# Patient Record
Sex: Male | Born: 1970 | Race: Black or African American | Hispanic: No | Marital: Single | Smoking: Former smoker
Health system: Southern US, Community
[De-identification: ages and names within clinical notes are randomized; demographics above are authoritative.]

## PROBLEM LIST (undated history)

## (undated) DIAGNOSIS — I10 Essential (primary) hypertension: Secondary | ICD-10-CM

## (undated) HISTORY — PX: KNEE SURGERY: SHX244

## (undated) HISTORY — PX: SHOULDER SURGERY: SHX246

## (undated) HISTORY — PX: TONSILLECTOMY: SUR1361

---

## 2015-06-01 ENCOUNTER — Encounter: Payer: Self-pay | Admitting: Gynecology

## 2015-06-01 ENCOUNTER — Ambulatory Visit
Admission: EM | Admit: 2015-06-01 | Discharge: 2015-06-01 | Disposition: A | Discharge status: Home / Self Care | Payer: BLUE CROSS/BLUE SHIELD | Attending: Family Medicine | Admitting: Family Medicine

## 2015-06-01 DIAGNOSIS — S60469A Insect bite (nonvenomous) of unspecified finger, initial encounter: Secondary | ICD-10-CM

## 2015-06-01 DIAGNOSIS — W57XXXA Bitten or stung by nonvenomous insect and other nonvenomous arthropods, initial encounter: Secondary | ICD-10-CM

## 2015-06-01 DIAGNOSIS — L089 Local infection of the skin and subcutaneous tissue, unspecified: Secondary | ICD-10-CM | POA: Diagnosis not present

## 2015-06-01 MED ORDER — MUPIROCIN 2 % EX OINT: 1.0000 "application " | TOPICAL_OINTMENT | Freq: Three times a day (TID) | CUTANEOUS | Status: DC

## 2015-06-01 MED ORDER — SULFAMETHOXAZOLE-TRIMETHOPRIM 800-160 MG PO TABS: 1.0000 | ORAL_TABLET | Freq: Two times a day (BID) | ORAL | Status: DC

## 2015-06-01 NOTE — Discharge Instructions (Signed)
Cellulitis °Cellulitis is an infection of the skin and the tissue under the skin. The infected area is usually red and tender. This happens most often in the arms and lower legs. °HOME CARE  °· Take your antibiotic medicine as told. Finish the medicine even if you start to feel better. °· Keep the infected arm or leg raised (elevated). °· Put a warm cloth on the area up to 4 times per day. °· Only take medicines as told by your doctor. °· Keep all doctor visits as told. °GET HELP IF: °· You see red streaks on the skin coming from the infected area. °· Your red area gets bigger or turns a dark color. °· Your bone or joint under the infected area is painful after the skin heals. °· Your infection comes back in the same area or different area. °· You have a puffy (swollen) bump in the infected area. °· You have new symptoms. °· You have a fever. °GET HELP RIGHT AWAY IF:  °· You feel very sleepy. °· You throw up (vomit) or have watery poop (diarrhea). °· You feel sick and have muscle aches and pains. °  °This information is not intended to replace advice given to you by your health care provider. Make sure you discuss any questions you have with your health care provider. °  °Document Released: 01/06/2008 Document Revised: 04/10/2015 Document Reviewed: 10/05/2011 °Elsevier Interactive Patient Education ©2016 [employer] ° °Insect Bite °Mosquitoes, flies, fleas, bedbugs, and other insects can bite. Insect bites are different from insect stings. The bite may be red, puffy (swollen), and itchy for 2 to 4 days. Most bites get better on their own. °HOME CARE  °· Do not scratch the bite. °· Keep the bite clean and dry. Wash the bite with soap and water every day, as told by your doctor. °· If directed, apply ice to the bite area. °¨ Put ice in a plastic bag. °¨ Place a towel between your skin and the bag. °¨ Leave the ice on for 20 minutes, 2-3 times per day. °· Follow instructions from your doctor about using medicated  lotions or creams. These can help with itching. °· Apply or take over-the-counter and prescription medicines only as told by your doctor. °· If you were given an antibiotic medicine, use it as told by your doctor. Do not stop using the medicine even if your condition improves. °· Keep all follow-up visits as told by your doctor. This is important. °GET HELP IF: °· You have redness, swelling (inflammation), or pain near your bite that is getting worse. °· You have a fever. °GET HELP RIGHT AWAY IF:  °· You have joint pain.   °· You have fluid, blood, or pus coming from the bite area.   °· You have a headache. °· You have neck pain. °· You feel weaker than you normally do.   °· You have a rash.   °· You have chest pain. °· You have shortness of breath. °· You have stomach pain, feel sick to your stomach (nauseous), or throw up (vomit). °· You feel more tired or sleepy than you normally do. °  °This information is not intended to replace advice given to you by your health care provider. Make sure you discuss any questions you have with your health care provider. °  °Document Released: 07/17/2000 Document Revised: 04/10/2015 Document Reviewed: 12/05/2014 °Elsevier Interactive Patient Education ©2016 [employer] Inc[employer] ° °

## 2015-06-01 NOTE — ED Notes (Addendum)
Patient c/o left middle finger and hand swollen. Patient question insect bite to left middle finger. Per patient notice yesterday.

## 2015-06-01 NOTE — ED Provider Notes (Addendum)
CSN: [id number]     Arrival date & time 06/01/15  0848 History   First MD Initiated Contact with Patient 06/01/15 [phone]    Nurses notes were reviewed. Chief Complaint  Patient presents with  . Hand Problem   patient reports waking up yesterday morning with middle finger swollen and tender. He states his mother told him it looks like there was a bite on the hand on the middle finger but he doesn't remember being bitten by anything as he states he doesn't see any spiders in the house. Explained to him that spiders are not necessarily visible but staph infections can occur from a single spider bite. States his tetanus is up-to-date he does smoke. He denies any trauma to the hand or extremity.` (Consider location/radiation/quality/duration/timing/severity/associated sxs/prior Treatment) Patient is a 44 y.o. male presenting with animal bite. The history is provided by the patient. A language interpreter was used.  Animal Bite Contact animal:  Insect Location:  Finger Finger injury location:  L long finger Time since incident:  1 day Pain details:    Quality:  Aching, sharp and sore   Severity:  Moderate   Timing:  Constant   Progression:  Unchanged Notifications:  None Animal's rabies vaccination status:  Up to date Relieved by:  Nothing Ineffective treatments:  None tried Associated symptoms: swelling   Associated symptoms: no fever, no numbness and no rash     History reviewed. No pertinent past medical history. Past Surgical History  Procedure Laterality Date  . Shoulder surgery Right   . Tonsillectomy    . Knee surgery Left    History reviewed. No pertinent family history. Social History  Substance Use Topics  . Smoking status: Current Every Day Smoker -- 0.50 packs/day  . Smokeless tobacco: None  . Alcohol Use: No    Review of Systems  Constitutional: Negative for fever.  Musculoskeletal: Positive for joint swelling and gait problem.       Patient has to have repeat knee  surgery in a few weeks.  Skin: Negative for rash.  Neurological: Negative for numbness.  All other systems reviewed and are negative.   Allergies  Penicillins  Home Medications   Prior to Admission medications   Medication Sig Start Date End Date Taking? Authorizing Provider  Multiple Vitamin (MULTIVITAMIN) capsule Take 1 capsule by mouth daily.   Yes Historical Provider, MD  ranitidine (ZANTAC) 150 MG/10ML syrup Take by mouth 2 (two) times daily.   Yes Historical Provider, MD  mupirocin ointment (BACTROBAN) 2 % Apply 1 application topically 3 (three) times daily. 06/01/15   Hassan RowanEugene Dayja Loveridge, MD  sulfamethoxazole-trimethoprim (BACTRIM DS,SEPTRA DS) 800-160 MG tablet Take 1 tablet by mouth 2 (two) times daily. 06/01/15   Hassan RowanEugene Cynthia Stainback, MD   Meds Ordered and Administered this Visit  Medications - No data to display  BP 141/94 mmHg  Pulse 81  Temp(Src) 98.2 F (36.8 C)  Resp 18  Ht 6' (1.829 m)  Wt 238 lb (107.956 kg)  BMI 32.27 kg/m2  SpO2 100% No data found.   Physical Exam  Constitutional: He is oriented to person, place, and time. He appears well-developed and well-nourished.  HENT:  Head: Normocephalic and atraumatic.  Eyes: Pupils are equal, round, and reactive to light.  Musculoskeletal: He exhibits edema and tenderness.       Hands: Left middle finger is swollen and very tender to touch  Neurological: He is alert and oriented to person, place, and time.  Skin: Skin is warm. Rash noted.  There is erythema.  Psychiatric: He has a normal mood and affect. His behavior is normal.  Vitals reviewed.   ED Course  Procedures (including critical care time)  Labs Review Labs Reviewed - No data to display  Imaging Review No results found.   Visual Acuity Review  Right Eye Distance:   Left Eye Distance:   Bilateral Distance:    Right Eye Near:   Left Eye Near:    Bilateral Near:         MDM   1. Finger infection   2. Insect bite finger-infected, initial  encounter    Explain to the patient will place him on Septra DS 1 tablet twice a day and ointment applied to the middle finger 3 times a day. I suggested he go to the ED at [facility] since he lives in [place] if not better by Monday since he made to hand surgeon if finger gets worse recommend icing it and soaking in Epsom salts. Patient still seems upset she does remember having insect bite wondering how he could have been bitten.    Hassan Rowan, MD 06/01/15 1030  Hassan Rowan, MD 06/01/15 [id number]

## 2017-05-10 ENCOUNTER — Ambulatory Visit
Admission: EM | Admit: 2017-05-10 | Discharge: 2017-05-10 | Disposition: A | Discharge status: Home / Self Care | Payer: BLUE CROSS/BLUE SHIELD | Attending: Family Medicine | Admitting: Family Medicine

## 2017-05-10 ENCOUNTER — Ambulatory Visit (INDEPENDENT_AMBULATORY_CARE_PROVIDER_SITE_OTHER): Payer: BLUE CROSS/BLUE SHIELD

## 2017-05-10 ENCOUNTER — Encounter: Payer: Self-pay | Admitting: *Deleted

## 2017-05-10 DIAGNOSIS — S39012A Strain of muscle, fascia and tendon of lower back, initial encounter: Secondary | ICD-10-CM | POA: Diagnosis not present

## 2017-05-10 DIAGNOSIS — M545 Low back pain: Secondary | ICD-10-CM | POA: Diagnosis not present

## 2017-05-10 LAB — URINALYSIS, COMPLETE (UACMP) WITH MICROSCOPIC
Bacteria, UA: NONE SEEN
Bilirubin Urine: NEGATIVE
Glucose, UA: NEGATIVE mg/dL
Ketones, ur: NEGATIVE mg/dL
Leukocytes, UA: NEGATIVE
Nitrite: NEGATIVE
Protein, ur: NEGATIVE mg/dL
Specific Gravity, Urine: 1.02 (ref 1.005–1.030)
pH: 6 (ref 5.0–8.0)

## 2017-05-10 MED ORDER — METAXALONE 800 MG PO TABS: 800.0000 mg | ORAL_TABLET | Freq: Three times a day (TID) | ORAL | 0 refills | Status: DC

## 2017-05-10 MED ORDER — KETOROLAC TROMETHAMINE 60 MG/2ML IM SOLN: 60.0000 mg | Freq: Once | INTRAMUSCULAR | Status: DC

## 2017-05-10 MED ORDER — NAPROXEN 500 MG PO TABS: 500.0000 mg | ORAL_TABLET | Freq: Two times a day (BID) | ORAL | 0 refills | Status: DC

## 2017-05-10 MED ORDER — KETOROLAC TROMETHAMINE 60 MG/2ML IM SOLN
60.0000 mg | Freq: Once | INTRAMUSCULAR | Status: AC
  Administered 2017-05-10: 60 mg via INTRAMUSCULAR

## 2017-05-10 NOTE — ED Triage Notes (Signed)
Onset of left sided low back pain while seated on stadium seat at football game Saturday. Denies injury. Pain has worsened since onset.

## 2017-05-10 NOTE — ED Provider Notes (Signed)
[facility]    CSN: [id number] Arrival date & time: 05/10/17  0941     History   Chief Complaint Chief Complaint  Patient presents with  . Back Pain    HPI Mitchell Mcgee is a 46 y.o. male.   HPI  This 46 year old male who presents with left-sided nonradiating low back pain where he indicates the lower lumbar sacral area and sacroiliac joint. States that he noticed it on Saturday 2 days prior to this visit. He states that the pain has progressively worsened. He does not remember any specific injury. He did take a car trip to [place] on hard hotel bed and then went to a football game where he sat on a stadium seat. He states that at the end of the football game he was unable to walk back to his car and needed transportation.  no radicular symptoms. Markedt right list in stance. Pain is worse with any movement.        History reviewed. No pertinent past medical history.  There are no active problems to display for this patient.   Past Surgical History:  Procedure Laterality Date  . KNEE SURGERY Left   . SHOULDER SURGERY Right   . TONSILLECTOMY         Home Medications    Prior to Admission medications   Medication Sig Start Date End Date Taking? Authorizing Provider  metaxalone (SKELAXIN) 800 MG tablet Take 1 tablet (800 mg total) by mouth 3 (three) times daily. 05/10/17   Lutricia Feil, PA-C  Multiple Vitamin (MULTIVITAMIN) capsule Take 1 capsule by mouth daily.    [provider]  mupirocin ointment (BACTROBAN) 2 % Apply 1 application topically 3 (three) times daily. 06/01/15   Hassan Rowan, MD  naproxen (NAPROSYN) 500 MG tablet Take 1 tablet (500 mg total) by mouth 2 (two) times daily. 05/10/17   Lutricia Feil, PA-C  ranitidine (ZANTAC) 150 MG/10ML syrup Take by mouth 2 (two) times daily.    [provider]  sulfamethoxazole-trimethoprim (BACTRIM DS,SEPTRA DS) 800-160 MG tablet Take 1 tablet by mouth 2 (two) times daily.  06/01/15   Hassan Rowan, MD    Family History History reviewed. No pertinent family history.  Social History Social History  Substance Use Topics  . Smoking status: Current Every Day Smoker    Packs/day: 0.50  . Smokeless tobacco: Never Used  . Alcohol use No     Allergies   Penicillins   Review of Systems Review of Systems  Constitutional: Positive for activity change. Negative for chills, fatigue and fever.  Musculoskeletal: Positive for back pain, gait problem and myalgias.  All other systems reviewed and are negative.    Physical Exam Triage Vital Signs ED Triage Vitals  Enc Vitals Group     BP 05/10/17 1028 (!) 165/113     Pulse Rate 05/10/17 1028 74     Resp 05/10/17 1028 20     Temp 05/10/17 1028 98.1 F (36.7 C)     Temp Source 05/10/17 1028 Oral     SpO2 05/10/17 1028 99 %     Weight 05/10/17 1030 265 lb (120.2 kg)     Height 05/10/17 1030 6' (1.829 m)     Head Circumference --      Peak Flow --      Pain Score 05/10/17 1031 9     Pain Loc --      Pain Edu? --      Excl. in GC? --  No data found.   Updated Vital Signs BP (!) 165/113 (BP Location: Left Arm)   Pulse 74   Temp 98.1 F (36.7 C) (Oral)   Resp 20   Ht 6' (1.829 m)   Wt 265 lb (120.2 kg)   SpO2 99%   BMI 35.94 kg/m   Visual Acuity Right Eye Distance:   Left Eye Distance:   Bilateral Distance:    Right Eye Near:   Left Eye Near:    Bilateral Near:     Physical Exam  Constitutional: He is oriented to person, place, and time. He appears well-developed and well-nourished. No distress.  HENT:  Head: Normocephalic.  Eyes: Pupils are equal, round, and reactive to light.  Neck: Normal range of motion.  Musculoskeletal: He exhibits tenderness and deformity.  Examination of the lumbar spine shows some marked listing to the right. She is able to forward flex only to the level of his mid thighs with his hands. Returning to upright posture is very difficult. He also maintains a  10-15 for flexion. He is able to toe walk and heel walk adequately. EHL peroneal and anterior tibialis muscles are strong to clinical testing. Sensation is intact of the distal extremities. DTRs are 2+ over 4 and symmetrical. Straight leg raise testing in the sitting position at 90 gives of pain to the left of lower back with a cross positive on the right.  Neurological: He is alert and oriented to person, place, and time.  Skin: Skin is warm and dry. He is not diaphoretic.  Psychiatric: He has a normal mood and affect. His behavior is normal. Judgment and thought content normal.  Nursing note and vitals reviewed.    UC Treatments / Results  Labs (all labs ordered are listed, but only abnormal results are displayed) Labs Reviewed  URINALYSIS, COMPLETE (UACMP) WITH MICROSCOPIC - Abnormal; Notable for the following:       Result Value   Hgb urine dipstick TRACE (*)    Squamous Epithelial / LPF 0-5 (*)    All other components within normal limits    EKG  EKG Interpretation None       Radiology Dg Lumbar Spine Complete  Result Date: 05/10/2017 CLINICAL DATA:  46 year old male with lumbar back pain onset recently with no specific injury. No radicular pain. EXAM: LUMBAR SPINE - COMPLETE 4+ VIEW COMPARISON:  None. FINDINGS: Normal lumbar segmentation. Preserved lordosis, vertebral height and alignment. L2-L3, L3-L4 and L5-S1 disc space loss and endplate spurring. No pars fracture or facet hypertrophy. Sacral ala and SI joints appear normal. Visible lower thoracic levels appear intact with questionable lower thoracic vacuum disc. No acute osseous abnormality identified. IMPRESSION: No acute osseous abnormality. 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 Electronically Signed   By: Odessa Fleming M.D.   On: 05/10/2017 11:39    Procedures Procedures (including critical care time)  Medications Ordered in UC Medications  ketorolac (TORADOL) injection 60 mg (60 mg  Intramuscular Given 05/10/17 1114)   Patient received moderate relief from the injection  Initial Impression / Assessment and Plan / UC Course  I have reviewed the triage vital signs and the nursing notes.  Pertinent labs & imaging results that were available during my care of the patient were reviewed by me and considered in my medical decision making (see chart for details).     Plan: 1. Test/x-ray results and diagnosis reviewed with patient 2. rx as per orders; risks, benefits, potential side effects reviewed with  patient 3. Recommend supportive treatment with rest and symptom avoidance. Avoid sitting lifting and bending. Use ice 20 minutes out of every 2 hours 4-5 times daily. Recommend following up with your primary care physician. Ultimately will need to work on core exercises. 4. F/u prn if symptoms worsen or don't improve   Final Clinical Impressions(s) / UC Diagnoses   Final diagnoses:  Strain of lumbar region, initial encounter    New Prescriptions Discharge Medication List as of 05/10/2017 11:55 AM    START taking these medications   Details  metaxalone (SKELAXIN) 800 MG tablet Take 1 tablet (800 mg total) by mouth 3 (three) times daily., Starting Mon 05/10/2017, Normal    naproxen (NAPROSYN) 500 MG tablet Take 1 tablet (500 mg total) by mouth 2 (two) times daily., Starting Mon 05/10/2017, Normal         Controlled Substance Prescriptions [place] Controlled Substance Registry consulted? Not Applicable   Lutricia Feil, PA-C 05/10/17 1201

## 2017-08-10 ENCOUNTER — Encounter: Payer: Self-pay | Admitting: *Deleted

## 2017-08-10 ENCOUNTER — Ambulatory Visit
Admission: EM | Admit: 2017-08-10 | Discharge: 2017-08-10 | Disposition: A | Discharge status: Home / Self Care | Payer: BLUE CROSS/BLUE SHIELD | Attending: Family Medicine | Admitting: Family Medicine

## 2017-08-10 DIAGNOSIS — W108XXA Fall (on) (from) other stairs and steps, initial encounter: Secondary | ICD-10-CM

## 2017-08-10 DIAGNOSIS — S39012A Strain of muscle, fascia and tendon of lower back, initial encounter: Secondary | ICD-10-CM

## 2017-08-10 HISTORY — DX: Essential (primary) hypertension: I10

## 2017-08-10 MED ORDER — MELOXICAM 15 MG PO TABS: 15.0000 mg | ORAL_TABLET | Freq: Every day | ORAL | 0 refills | Status: DC

## 2017-08-10 MED ORDER — CYCLOBENZAPRINE HCL 10 MG PO TABS: 10.0000 mg | ORAL_TABLET | Freq: Three times a day (TID) | ORAL | 0 refills | Status: DC | PRN

## 2017-08-10 NOTE — ED Provider Notes (Signed)
[facility]    CSN: [id number] Arrival date & time: 08/10/17  0834  History   Chief Complaint Chief Complaint  Patient presents with  . Back Pain   HPI  47 year old male presents with back pain.  Patient states that yesterday he was going up the steps at work.  He missed a step and injured his left low back.  He states that it was mild yesterday and he was able to work without difficulty.  His pain has slowly progressed.  Currently 8/10 in severity.  He reports left low back pain with radiation down his left leg.  No reports of numbness or tingling.  Worse with initial movements/getting up from a seated position.  He is taken Tylenol and used an over-the-counter ointment without relief.  No urinary incontinence or saddle anesthesia.  No other associated symptoms.  No other complaints at this time.  Past Medical History:  Diagnosis Date  . Hypertension    Past Surgical History:  Procedure Laterality Date  . KNEE SURGERY Left   . SHOULDER SURGERY Right   . TONSILLECTOMY     Home Medications    Prior to Admission medications   Medication Sig Start Date End Date Taking? Authorizing Provider  Multiple Vitamin (MULTIVITAMIN) capsule Take 1 capsule by mouth daily.   Yes [provider]  mupirocin ointment (BACTROBAN) 2 % Apply 1 application topically 3 (three) times daily. 06/01/15  Yes Hassan RowanWade, Eugene, MD  ranitidine (ZANTAC) 150 MG/10ML syrup Take by mouth 2 (two) times daily.   Yes [provider]  cyclobenzaprine (FLEXERIL) 10 MG tablet Take 1 tablet (10 mg total) by mouth 3 (three) times daily as needed for muscle spasms. 08/10/17   Tommie Samsook, Volanda Mangine G, DO  meloxicam (MOBIC) 15 MG tablet Take 1 tablet (15 mg total) by mouth daily. 08/10/17   Tommie Samsook, Marena Witts G, DO    Family History Family History  Problem Relation Age of Onset  . Hypertension Mother   . Diabetes Mother   . Cancer Mother   . Hypertension Father   . Cancer Father     Social History Social  History   Tobacco Use  . Smoking status: Former Smoker    Packs/day: 0.50  . Smokeless tobacco: Never Used  Substance Use Topics  . Alcohol use: No  . Drug use: No     Allergies   Penicillins   Review of Systems Review of Systems  Constitutional: Negative.   Genitourinary: Negative.   Musculoskeletal: Positive for back pain.   Physical Exam Triage Vital Signs ED Triage Vitals  Enc Vitals Group     BP 08/10/17 0851 (!) 172/94     Pulse Rate 08/10/17 0851 79     Resp 08/10/17 0851 16     Temp 08/10/17 0851 97.9 F (36.6 C)     Temp Source 08/10/17 0851 Oral     SpO2 08/10/17 0851 97 %     Weight 08/10/17 0853 270 lb (122.5 kg)     Height 08/10/17 0853 6' (1.829 m)     Head Circumference --      Peak Flow --      Pain Score 08/10/17 0854 8     Pain Loc --      Pain Edu? --    Updated Vital Signs BP (!) 172/94 (BP Location: Left Arm)   Pulse 79   Temp 97.9 F (36.6 C) (Oral)   Resp 16   Ht 6' (1.829 m)   Wt 270  lb (122.5 kg)   SpO2 97%   BMI 36.62 kg/m   Physical Exam  Constitutional: He is oriented to person, place, and time. He appears well-developed and well-nourished. No distress.  Cardiovascular: Normal rate and regular rhythm.  No murmur heard. Pulmonary/Chest: Effort normal and breath sounds normal. No respiratory distress. He has no wheezes. He has no rales.  Musculoskeletal:  Lumbar spine -no discrete areas of tenderness.  Negative straight leg raise.  Decreased range of motion secondary to pain.  Neurological: He is alert and oriented to person, place, and time.  Psychiatric: He has a normal mood and affect. His behavior is normal.  Nursing note and vitals reviewed.  UC Treatments / Results  Labs (all labs ordered are listed, but only abnormal results are displayed) Labs Reviewed - No data to display  EKG  EKG Interpretation None       Radiology No results found.  Procedures Procedures (including critical care time)  Medications  Ordered in UC Medications - No data to display   Initial Impression / Assessment and Plan / UC Course  I have reviewed the triage vital signs and the nursing notes.  Pertinent labs & imaging results that were available during my care of the patient were reviewed by me and considered in my medical decision making (see chart for details).    47 year old male presents with back pain.  Prior history of back pain. Musculoskeletal in nature.  Likely lumbar strain.  Treating with Flexeril and meloxicam.  Final Clinical Impressions(s) / UC Diagnoses   Final diagnoses:  Strain of lumbar region, initial encounter    ED Discharge Orders        Ordered    cyclobenzaprine (FLEXERIL) 10 MG tablet  3 times daily PRN     08/10/17 0906    meloxicam (MOBIC) 15 MG tablet  Daily     08/10/17 0906     Controlled Substance Prescriptions [place] Controlled Substance Registry consulted? Not Applicable   Tommie Sams, [place] 08/10/17 1610

## 2017-08-10 NOTE — ED Triage Notes (Signed)
While going up stairs at work yesterday, pt missed a step and tripped. Had onset of low back pain which was mild at onset but has worsened overnight. Pt now c/o left sided low back pain that radiates to left leg. Has hx of chronic back pain.

## 2017-08-13 ENCOUNTER — Telehealth: Payer: Self-pay

## 2017-08-13 NOTE — Telephone Encounter (Signed)
Called to follow up with patient since visit here at [facility]. Patient instructed to call back with any questions or concerns. MAH  

## 2018-03-04 ENCOUNTER — Ambulatory Visit
Admission: EM | Admit: 2018-03-04 | Discharge: 2018-03-04 | Disposition: A | Discharge status: Home / Self Care | Payer: BLUE CROSS/BLUE SHIELD | Attending: Family Medicine | Admitting: Family Medicine

## 2018-03-04 ENCOUNTER — Other Ambulatory Visit: Payer: Self-pay

## 2018-03-04 ENCOUNTER — Encounter: Payer: Self-pay | Admitting: Emergency Medicine

## 2018-03-04 DIAGNOSIS — L0291 Cutaneous abscess, unspecified: Secondary | ICD-10-CM

## 2018-03-04 DIAGNOSIS — L03116 Cellulitis of left lower limb: Secondary | ICD-10-CM | POA: Diagnosis not present

## 2018-03-04 DIAGNOSIS — L02416 Cutaneous abscess of left lower limb: Secondary | ICD-10-CM | POA: Diagnosis not present

## 2018-03-04 MED ORDER — MUPIROCIN 2 % EX OINT: 1.0000 "application " | TOPICAL_OINTMENT | Freq: Three times a day (TID) | CUTANEOUS | 0 refills | Status: DC

## 2018-03-04 MED ORDER — DOXYCYCLINE HYCLATE 100 MG PO CAPS: 100.0000 mg | ORAL_CAPSULE | Freq: Two times a day (BID) | ORAL | 0 refills | Status: DC

## 2018-03-04 NOTE — ED Provider Notes (Addendum)
[facility]    CSN: [id number] Arrival date & time: 03/04/18  1757     History   Chief Complaint Chief Complaint  Patient presents with  . Insect Bite    left lower leg    HPI Mitchell Mcgee is a 47 y.o. male.   HPI  47 year old obese male who presents with a possible insect bite to his left lower extremity proximally and laterally.  He thinks that it occurred on Wednesday, 03/02/2018.  States that he felt an area that he itched, when he scratched  it eaked  fluid.  HE cleansed The area with alcohol and applied hydrocortisone cream and  a Band-Aid.  He became alarmed today when he got home from work removed his boots and found that his foot was really swollen and the area was red and warm. Has had no fever or chills.  He is afebrile today.  Blood pressure is elevated but he does have a history of hypertension.  Need to see his primary care provider for further evaluation and treatment.      Past Medical History:  Diagnosis Date  . Hypertension     There are no active problems to display for this patient.   Past Surgical History:  Procedure Laterality Date  . KNEE SURGERY Left   . SHOULDER SURGERY Right   . TONSILLECTOMY         Home Medications    Prior to Admission medications   Medication Sig Start Date End Date Taking? Authorizing Provider  Multiple Vitamin (MULTIVITAMIN) capsule Take 1 capsule by mouth daily.   Yes [provider]  ranitidine (ZANTAC) 150 MG/10ML syrup Take by mouth 2 (two) times daily.   Yes [provider]  doxycycline (VIBRAMYCIN) 100 MG capsule Take 1 capsule (100 mg total) by mouth 2 (two) times daily. 03/04/18   Lutricia Feil, PA-C  mupirocin ointment (BACTROBAN) 2 % Apply 1 application topically 3 (three) times daily. 03/04/18   Lutricia Feil, PA-C    Family History Family History  Problem Relation Age of Onset  . Hypertension Mother   . Diabetes Mother   . Cancer Mother   . Hypertension Father     . Cancer Father     Social History Social History   Tobacco Use  . Smoking status: Former Smoker    Packs/day: 0.50    Last attempt to quit: 09/2017    Years since quitting: 0.4  . Smokeless tobacco: Never Used  Substance Use Topics  . Alcohol use: No  . Drug use: No     Allergies   Penicillins   Review of Systems Review of Systems  Constitutional: Positive for activity change. Negative for chills, fatigue and fever.  Skin: Positive for color change and wound.  All other systems reviewed and are negative.    Physical Exam Triage Vital Signs ED Triage Vitals  Enc Vitals Group     BP 03/04/18 1809 (!) 177/121     Pulse Rate 03/04/18 1809 81     Resp 03/04/18 1809 16     Temp 03/04/18 1809 98.6 F (37 C)     Temp Source 03/04/18 1809 Oral     SpO2 03/04/18 1809 98 %     Weight 03/04/18 1808 (!) 303 lb (137.4 kg)     Height 03/04/18 1808 6' (1.829 m)     Head Circumference --      Peak Flow --      Pain Score 03/04/18 1808  0     Pain Loc --      Pain Edu? --      Excl. in GC? --    No data found.  Updated Vital Signs BP (!) 176/118 (BP Location: Right Arm)   Pulse 81   Temp 98.6 F (37 C) (Oral)   Resp 16   Ht 6' (1.829 m)   Wt (!) 303 lb (137.4 kg)   SpO2 98%   BMI 41.09 kg/m   Visual Acuity Right Eye Distance:   Left Eye Distance:   Bilateral Distance:    Right Eye Near:   Left Eye Near:    Bilateral Near:     Physical Exam  Constitutional: He is oriented to person, place, and time. He appears well-developed and well-nourished. No distress.  HENT:  Head: Normocephalic.  Eyes: Pupils are equal, round, and reactive to light. Right eye exhibits no discharge. Left eye exhibits no discharge.  Neck: Normal range of motion.  Musculoskeletal: Normal range of motion. He exhibits edema.  Neurological: He is alert and oriented to person, place, and time.  Skin: Skin is warm and dry. He is not diaphoretic. There is erythema.  Examination of the  left lower leg shows a small crater  Likely  From a previous abscess with desquamation of the surrounding skin.  He has no drainage that can be expressed.  Area appears clean.  There is only a small amount of induration circumferentially.  He does have 2+ pitting edema of the lower extremity extending into the foot.  It does not extend above the knee.  The area is mildly warm. Refer to photographs for detail  Psychiatric: He has a normal mood and affect. His behavior is normal. Judgment and thought content normal.  Nursing note and vitals reviewed.        UC Treatments / Results  Labs (all labs ordered are listed, but only abnormal results are displayed) Labs Reviewed - No data to display  EKG None  Radiology No results found.  Procedures Procedures (including critical care time)  Medications Ordered in UC Medications - No data to display  Initial Impression / Assessment and Plan / UC Course  I have reviewed the triage vital signs and the nursing notes.  Pertinent labs & imaging results that were available during my care of the patient were reviewed by me and considered in my medical decision making (see chart for details).     Plan: 1. Test/x-ray results and diagnosis reviewed with patient 2. rx as per orders; risks, benefits, potential side effects reviewed with patient 3. Recommend supportive treatment with elevation above the level of his heart to sufficiently to control swelling over the weekend.  We will start him on warm compresses and Bactroban ointment application.  Also start him on doxycycline for 5 days treatment.  Because of the amount of swelling I will have him return in 2 days for a wound recheck.  Wound was not repacked  due to it being open and not draining any further.  Patient will need to make an appointment with his primary care physician for better hypertension control 4. F/u prn if symptoms worsen or don't improve  Final Clinical Impressions(s) / UC  Diagnoses   Final diagnoses:  Abscess  Cellulitis of left lower extremity     Discharge Instructions     Use warm compresses on the area 4 times daily for 10 minutes each time. Dry Thoroughly and apply mupirocin ointment.  Return to clinic  in 2 days for follow-up.  Elevate your leg above the level of your heart most of the time to help with the swelling.  Take the oral antibiotic until completion.   ED Prescriptions    Medication Sig Dispense Auth. Provider   doxycycline (VIBRAMYCIN) 100 MG capsule Take 1 capsule (100 mg total) by mouth 2 (two) times daily. 10 capsule Ovid Curdoemer, Vinaya Sancho P, PA-C   mupirocin ointment (BACTROBAN) 2 % Apply 1 application topically 3 (three) times daily. 22 g Lutricia Feiloemer, Agusta Hackenberg P, PA-C     Controlled Substance Prescriptions [place] Controlled Substance Registry consulted? Not Applicable   Lutricia FeilRoemer, Rudell Ortman P, PA-C 03/04/18 1923    Lutricia FeilRoemer, Christophere Hillhouse P, PA-C 03/04/18 1925

## 2018-03-04 NOTE — ED Triage Notes (Signed)
Patient in today c/o insect bite on Wednesday (03/02/18). Patient cleaned area with alcohol, hydrocortisone cream and band-aid. Today when he got home from work his foot is swollen and area is red.

## 2018-03-04 NOTE — Discharge Instructions (Addendum)
Use warm compresses on the area 4 times daily for 10 minutes each time. Dry Thoroughly and apply mupirocin ointment.  Return to clinic in 2 days for follow-up.  Elevate your leg above the level of your heart most of the time to help with the swelling.  Take the oral antibiotic until completion.

## 2018-03-06 ENCOUNTER — Ambulatory Visit
Admission: EM | Admit: 2018-03-06 | Discharge: 2018-03-06 | Disposition: A | Discharge status: Home / Self Care | Payer: BLUE CROSS/BLUE SHIELD | Attending: Family Medicine | Admitting: Family Medicine

## 2018-03-06 ENCOUNTER — Other Ambulatory Visit: Payer: Self-pay

## 2018-03-06 DIAGNOSIS — L02416 Cutaneous abscess of left lower limb: Secondary | ICD-10-CM | POA: Diagnosis not present

## 2018-03-06 DIAGNOSIS — Z5189 Encounter for other specified aftercare: Secondary | ICD-10-CM

## 2018-03-06 MED ORDER — DOXYCYCLINE HYCLATE 100 MG PO CAPS: 100.0000 mg | ORAL_CAPSULE | Freq: Two times a day (BID) | ORAL | 0 refills | Status: DC

## 2018-03-06 NOTE — Discharge Instructions (Signed)
Additional 5 days of antibiotic.  Keep a close eye.  Take care  Dr. Adriana Simasook

## 2018-03-06 NOTE — ED Triage Notes (Signed)
Pt with wound/infection to left lower leg and seen here on Friday. Taking ABX and using warm compresses. States swelling has improved and denies pain currently

## 2018-03-06 NOTE — ED Provider Notes (Signed)
[facility]    CSN: [id number] Arrival date & time: 03/06/18  0840  History   Chief Complaint Chief Complaint  Patient presents with  . Wound Check   HPI   47 year old male presents for reevaluation.  Patient was seen on Friday for leg wound.  He was placed on antibiotics.  He states that he is improving.  He presents for reevaluation as he was instructed to do so.  He states swelling has improved.  Area continues to drain slightly.  No fevers or chills.  He endorses compliance with his antibiotic.  No other associated symptoms.  No other complaints.  Past Medical History:  Diagnosis Date  . Hypertension    Past Surgical History:  Procedure Laterality Date  . KNEE SURGERY Left   . SHOULDER SURGERY Right   . TONSILLECTOMY     Home Medications    Prior to Admission medications   Medication Sig Start Date End Date Taking? Authorizing Provider  doxycycline (VIBRAMYCIN) 100 MG capsule Take 1 capsule (100 mg total) by mouth 2 (two) times daily. 03/04/18   Lutricia Feiloemer, William P, PA-C  doxycycline (VIBRAMYCIN) 100 MG capsule Take 1 capsule (100 mg total) by mouth 2 (two) times daily. 03/06/18   Tommie Samsook, Jeri Jeanbaptiste G, DO  Multiple Vitamin (MULTIVITAMIN) capsule Take 1 capsule by mouth daily.    [provider]  mupirocin ointment (BACTROBAN) 2 % Apply 1 application topically 3 (three) times daily. 03/04/18   Lutricia Feiloemer, William P, PA-C  ranitidine (ZANTAC) 150 MG/10ML syrup Take by mouth 2 (two) times daily.    [provider]   Family History Family History  Problem Relation Age of Onset  . Hypertension Mother   . Diabetes Mother   . Cancer Mother   . Hypertension Father   . Cancer Father    Social History Social History   Tobacco Use  . Smoking status: Former Smoker    Packs/day: 0.50    Last attempt to quit: 09/2017    Years since quitting: 0.5  . Smokeless tobacco: Never Used  Substance Use Topics  . Alcohol use: No  . Drug use: No   Allergies     Penicillins  Review of Systems Review of Systems  Constitutional: Negative.   Skin: Positive for wound.   Physical Exam Triage Vital Signs ED Triage Vitals  Enc Vitals Group     BP 03/06/18 0849 (!) 163/115     Pulse Rate 03/06/18 0849 77     Resp 03/06/18 0849 20     Temp 03/06/18 0849 98.6 F (37 C)     Temp Source 03/06/18 0849 Oral     SpO2 03/06/18 0849 98 %     Weight 03/06/18 0851 (!) 303 lb (137.4 kg)     Height 03/06/18 0851 6' (1.829 m)     Head Circumference --      Peak Flow --      Pain Score 03/06/18 0851 0     Pain Loc --      Pain Edu? --      Excl. in GC? --    No data found.  Updated Vital Signs BP (!) 163/115 (BP Location: Left Arm)   Pulse 77   Temp 98.6 F (37 C) (Oral)   Resp 20   Ht 6' (1.829 m)   Wt (!) 303 lb (137.4 kg)   SpO2 98%   BMI 41.09 kg/m   Visual Acuity Right Eye Distance:   Left Eye Distance:  Bilateral Distance:    Right Eye Near:   Left Eye Near:    Bilateral Near:     Physical Exam  Constitutional: He is oriented to person, place, and time. He appears well-developed. No distress.  HENT:  Head: Normocephalic and atraumatic.  Pulmonary/Chest: Effort normal. No respiratory distress.  Neurological: He is alert and oriented to person, place, and time.  Skin:  Wound continues to drain.  Swelling appears to be improved based on pictures.  No significant fluctuance that is amenable to drainage.  Mild erythema.  Psychiatric: He has a normal mood and affect. His behavior is normal.  Nursing note and vitals reviewed.  UC Treatments / Results  Labs (all labs ordered are listed, but only abnormal results are displayed) Labs Reviewed - No data to display  EKG None  Radiology No results found.  Procedures Procedures (including critical care time)  Medications Ordered in UC Medications - No data to display  Initial Impression / Assessment and Plan / UC Course  I have reviewed the triage vital signs and the  nursing notes.  Pertinent labs & imaging results that were available during my care of the patient were reviewed by me and considered in my medical decision making (see chart for details).    47 year old male presents for abscess reevaluation.  Placing on additional 5 days of doxycycline.  Final Clinical Impressions(s) / UC Diagnoses   Final diagnoses:  Abscess re-check     Discharge Instructions     Additional 5 days of antibiotic.  Keep a close eye.  Take care  Dr. Adriana Simas    ED Prescriptions    Medication Sig Dispense Auth. Provider   doxycycline (VIBRAMYCIN) 100 MG capsule Take 1 capsule (100 mg total) by mouth 2 (two) times daily. 10 capsule Tommie Sams, DO     Controlled Substance Prescriptions [place] Controlled Substance Registry consulted? Not Applicable   Tommie Sams, DO 03/06/18 1008

## 2020-01-31 ENCOUNTER — Other Ambulatory Visit: Payer: Self-pay

## 2020-01-31 ENCOUNTER — Encounter: Payer: Self-pay | Admitting: Emergency Medicine

## 2020-01-31 ENCOUNTER — Ambulatory Visit
Admission: EM | Admit: 2020-01-31 | Discharge: 2020-01-31 | Disposition: A | Discharge status: Home / Self Care | Payer: BC Managed Care – PPO | Attending: Family Medicine | Admitting: Family Medicine

## 2020-01-31 DIAGNOSIS — J0140 Acute pansinusitis, unspecified: Secondary | ICD-10-CM

## 2020-01-31 MED ORDER — IPRATROPIUM BROMIDE 0.06 % NA SOLN: 2.0000 | Freq: Four times a day (QID) | NASAL | 0 refills | Status: AC | PRN

## 2020-01-31 MED ORDER — DOXYCYCLINE HYCLATE 100 MG PO CAPS: 100.0000 mg | ORAL_CAPSULE | Freq: Two times a day (BID) | ORAL | 0 refills | Status: DC

## 2020-01-31 NOTE — ED Triage Notes (Signed)
Patient in today c/o sinus pressure and headache x 3 days. Patient states it is worse at night. Patient denies fever. Patient has been taking OTC Alka Seltzer sinus and cold without relief.

## 2020-02-01 NOTE — ED Provider Notes (Signed)
[facility]    CSN: [id number] Arrival date & time: 01/31/20  1527  History   Chief Complaint Chief Complaint  Patient presents with  . Sinus Problem  . Headache   HPI   49 year old male presents with the above complaints.  Patient reports a 3-day history of severe sinus pain and pressure.  Associated headache.  Also has congestion.  He has been taking over-the-counter Alka-Seltzer cold and sinus.  His blood pressure is markedly elevated today.  I believe that this is secondary to the decongestant in his medication.  Patient believes that he has a sinus fashion.  No documented fever.  No relieving factors.  No other associated symptoms.  No other complaints.  Past Medical History:  Diagnosis Date  . Hypertension    Past Surgical History:  Procedure Laterality Date  . KNEE SURGERY Left   . SHOULDER SURGERY Right   . TONSILLECTOMY     Home Medications    Prior to Admission medications   Medication Sig Start Date End Date Taking? Authorizing Provider  famotidine (PEPCID) 20 MG tablet Take 20 mg by mouth every other day.   Yes [provider]  Multiple Vitamin (MULTIVITAMIN) capsule Take 1 capsule by mouth daily.   Yes [provider]  doxycycline (VIBRAMYCIN) 100 MG capsule Take 1 capsule (100 mg total) by mouth 2 (two) times daily. 01/31/20   Everlene Other G, DO  ipratropium (ATROVENT) 0.06 % nasal spray Place 2 sprays into both nostrils 4 (four) times daily as needed for rhinitis. 01/31/20   Tommie Sams, DO  ranitidine (ZANTAC) 150 MG/10ML syrup Take by mouth 2 (two) times daily.  01/31/20  [provider]    Family History Family History  Problem Relation Age of Onset  . Hypertension Mother   . Diabetes Mother   . Cancer Mother   . Hypertension Father   . Cancer Father     Social History Social History   Tobacco Use  . Smoking status: Former Smoker    Packs/day: 0.50    Quit date: 09/2017    Years since quitting: 2.4  .  Smokeless tobacco: Never Used  Vaping Use  . Vaping Use: Never used  Substance Use Topics  . Alcohol use: No  . Drug use: No     Allergies   Penicillins   Review of Systems Review of Systems  Constitutional: Negative for fever.  HENT: Positive for congestion, sinus pressure and sinus pain.   Neurological: Positive for headaches.   Physical Exam Triage Vital Signs ED Triage Vitals  Enc Vitals Group     BP 01/31/20 1539 (!) 193/116     Pulse Rate 01/31/20 1539 84     Resp 01/31/20 1539 18     Temp 01/31/20 1539 98.3 F (36.8 C)     Temp Source 01/31/20 1539 Oral     SpO2 01/31/20 1539 100 %     Weight 01/31/20 1538 285 lb (129.3 kg)     Height 01/31/20 1538 6' (1.829 m)     Head Circumference --      Peak Flow --      Pain Score 01/31/20 1537 10     Pain Loc --      Pain Edu? --      Excl. in GC? --    Updated Vital Signs BP (!) 162/116 (BP Location: Left Arm)   Pulse 84   Temp 98.3 F (36.8 C) (Oral)   Resp 18  Ht 6' (1.829 m)   Wt 129.3 kg   SpO2 100%   BMI 38.65 kg/m   Visual Acuity Right Eye Distance:   Left Eye Distance:   Bilateral Distance:    Right Eye Near:   Left Eye Near:    Bilateral Near:     Physical Exam Vitals and nursing note reviewed.  Constitutional:      General: He is not in acute distress.    Appearance: Normal appearance. He is not ill-appearing.  HENT:     Head: Normocephalic and atraumatic.     Right Ear: Tympanic membrane normal.     Left Ear: Tympanic membrane normal.     Nose:     Right Sinus: Maxillary sinus tenderness and frontal sinus tenderness present.     Left Sinus: Maxillary sinus tenderness and frontal sinus tenderness present.  Eyes:     General:        Right eye: No discharge.        Left eye: No discharge.     Conjunctiva/sclera: Conjunctivae normal.  Cardiovascular:     Rate and Rhythm: Normal rate and regular rhythm.  Pulmonary:     Effort: Pulmonary effort is normal.     Breath sounds: Normal  breath sounds. No wheezing, rhonchi or rales.  Neurological:     Mental Status: He is alert.  Psychiatric:        Mood and Affect: Mood normal.        Behavior: Behavior normal.    UC Treatments / Results  Labs (all labs ordered are listed, but only abnormal results are displayed) Labs Reviewed - No data to display  EKG   Radiology No results found.  Procedures Procedures (including critical care time)  Medications Ordered in UC Medications - No data to display  Initial Impression / Assessment and Plan / UC Course  I have reviewed the triage vital signs and the nursing notes.  Pertinent labs & imaging results that were available during my care of the patient were reviewed by me and considered in my medical decision making (see chart for details).    49 year old male presents with sinusitis. Treating with Doxycycline and atrovent.  Final Clinical Impressions(s) / UC Diagnoses   Final diagnoses:  Acute pansinusitis, recurrence not specified   Discharge Instructions   None    ED Prescriptions    Medication Sig Dispense Auth. Provider   ipratropium (ATROVENT) 0.06 % nasal spray Place 2 sprays into both nostrils 4 (four) times daily as needed for rhinitis. 15 mL Makhiya Coburn G, DO   doxycycline (VIBRAMYCIN) 100 MG capsule Take 1 capsule (100 mg total) by mouth 2 (two) times daily. 14 capsule Everlene Other G, DO     PDMP not reviewed this encounter.   Everlene Other [place], [place] 02/01/20 [phone]

## 2020-07-12 ENCOUNTER — Ambulatory Visit
Admission: EM | Admit: 2020-07-12 | Discharge: 2020-07-12 | Disposition: A | Discharge status: Home / Self Care | Payer: BC Managed Care – PPO | Source: Home / Self Care | Attending: Sports Medicine | Admitting: Sports Medicine

## 2020-07-12 ENCOUNTER — Encounter: Payer: Self-pay | Admitting: Emergency Medicine

## 2020-07-12 ENCOUNTER — Other Ambulatory Visit: Payer: Self-pay

## 2020-07-12 ENCOUNTER — Ambulatory Visit
Admission: RE | Admit: 2020-07-12 | Discharge: 2020-07-12 | Disposition: A | Discharge status: Home / Self Care | Payer: BC Managed Care – PPO | Source: Ambulatory Visit | Attending: Sports Medicine | Admitting: Sports Medicine

## 2020-07-12 DIAGNOSIS — N50811 Right testicular pain: Secondary | ICD-10-CM | POA: Insufficient documentation

## 2020-07-12 DIAGNOSIS — N3001 Acute cystitis with hematuria: Secondary | ICD-10-CM | POA: Insufficient documentation

## 2020-07-12 DIAGNOSIS — N5089 Other specified disorders of the male genital organs: Secondary | ICD-10-CM | POA: Insufficient documentation

## 2020-07-12 LAB — CHLAMYDIA/NGC RT PCR (ARMC ONLY)
Chlamydia Tr: NOT DETECTED
N gonorrhoeae: DETECTED — AB

## 2020-07-12 LAB — URINALYSIS, COMPLETE (UACMP) WITH MICROSCOPIC
Bilirubin Urine: NEGATIVE
Glucose, UA: NEGATIVE mg/dL
Nitrite: NEGATIVE
Protein, ur: 100 mg/dL — AB
Specific Gravity, Urine: 1.025 (ref 1.005–1.030)
Squamous Epithelial / LPF: NONE SEEN (ref 0–5)
WBC, UA: >50 WBC/hpf (ref 0–5)
pH: 6 (ref 5.0–8.0)

## 2020-07-12 MED ORDER — DOXYCYCLINE HYCLATE 100 MG PO TABS: 100.0000 mg | ORAL_TABLET | Freq: Two times a day (BID) | ORAL | Status: DC

## 2020-07-12 MED ORDER — CIPROFLOXACIN HCL 500 MG PO TABS: 500.0000 mg | ORAL_TABLET | Freq: Two times a day (BID) | ORAL | 0 refills | Status: AC

## 2020-07-12 MED ORDER — DOXYCYCLINE HYCLATE 100 MG PO CAPS: 100.0000 mg | ORAL_CAPSULE | Freq: Two times a day (BID) | ORAL | 0 refills | Status: DC

## 2020-07-12 MED ORDER — CIPROFLOXACIN HCL 500 MG PO TABS: 500.0000 mg | ORAL_TABLET | Freq: Two times a day (BID) | ORAL | Status: DC

## 2020-07-12 MED ORDER — DOXYCYCLINE HYCLATE 100 MG PO CAPS: 100.0000 mg | ORAL_CAPSULE | Freq: Two times a day (BID) | ORAL | 0 refills | Status: AC

## 2020-07-12 NOTE — ED Triage Notes (Signed)
Pt states he passed 3 kidney stones last week, two on Saturday and one on Wednesday. Pt states his urine is very dark. Pt states he is having swelling and pain in the testicles. Pt states he has been drinking water and cranberry juice with no relief of symptoms.

## 2020-07-12 NOTE — ED Provider Notes (Signed)
[facility]    CSN: [id number] Arrival date & time: 07/12/20  1607      History   Chief Complaint Chief Complaint  Patient presents with  . Testicle Pain  . Fever    HPI Mitchell Mcgee is a 49 y.o. male.   49 yo male presents with 2 days of right sided testicular pain and swelling with pain on urination and dark urine despite drinking plenty of water.  He thinks he may have strain passing kidney stone, and with a BM.  No actual direct trauma.  Denies history of STD's or promiscuity. No history of hernia.     Past Medical History:  Diagnosis Date  . Hypertension     There are no problems to display for this patient.   Past Surgical History:  Procedure Laterality Date  . KNEE SURGERY Left   . SHOULDER SURGERY Right   . TONSILLECTOMY         Home Medications    Prior to Admission medications   Medication Sig Start Date End Date Taking? Authorizing Provider  doxycycline (VIBRAMYCIN) 100 MG capsule Take 1 capsule (100 mg total) by mouth 2 (two) times daily. 01/31/20   Tommie Sams, DO  famotidine (PEPCID) 20 MG tablet Take 20 mg by mouth every other day.    [provider]  ipratropium (ATROVENT) 0.06 % nasal spray Place 2 sprays into both nostrils 4 (four) times daily as needed for rhinitis. 01/31/20   Tommie Sams, DO  Multiple Vitamin (MULTIVITAMIN) capsule Take 1 capsule by mouth daily.    [provider]  ranitidine (ZANTAC) 150 MG/10ML syrup Take by mouth 2 (two) times daily.  01/31/20  [provider]    Family History Family History  Problem Relation Age of Onset  . Hypertension Mother   . Diabetes Mother   . Cancer Mother   . Hypertension Father   . Cancer Father     Social History Social History   Tobacco Use  . Smoking status: Former Smoker    Packs/day: 0.50    Quit date: 09/2017    Years since quitting: 2.8  . Smokeless tobacco: Never Used  Vaping Use  . Vaping Use: Never used  Substance Use Topics   . Alcohol use: No  . Drug use: No     Allergies   Penicillins   Review of Systems Review of Systems  Constitutional: Positive for diaphoresis and fever. Negative for activity change and appetite change.  Gastrointestinal: Negative for abdominal distention, abdominal pain, blood in stool, constipation, nausea and vomiting.  Genitourinary: Positive for dysuria, scrotal swelling and testicular pain. Negative for difficulty urinating and flank pain.  Skin: Negative for color change, pallor, rash and wound.     Physical Exam Triage Vital Signs ED Triage Vitals [07/12/20 0850]  Enc Vitals Group     BP (!) 179/104     Pulse Rate 94     Resp 20     Temp 99.8 F (37.7 C)     Temp Source Oral     SpO2 100 %     Weight      Height      Head Circumference      Peak Flow      Pain Score 6     Pain Loc      Pain Edu?      Excl. in GC?    No data found.  Updated Vital Signs BP (!) 179/104 (BP Location: Left  Arm)   Pulse 94   Temp 99.8 F (37.7 C) (Oral)   Resp 20   SpO2 100%   Visual Acuity Right Eye Distance:   Left Eye Distance:   Bilateral Distance:    Right Eye Near:   Left Eye Near:    Bilateral Near:     Physical Exam Vitals and nursing note reviewed.  Constitutional:      General: He is not in acute distress.    Appearance: Normal appearance. He is not ill-appearing, toxic-appearing or diaphoretic.  HENT:     Head: Normocephalic and atraumatic.  Abdominal:     General: There is no distension.     Palpations: There is no mass.     Tenderness: There is abdominal tenderness. There is no right CVA tenderness, left CVA tenderness, guarding or rebound.  Genitourinary:    Comments: uncircumsized penis, scant yellowish discharge from the penis, limited exam due to pain.  TTP right testicle. Unable to appreciate torsion or a hernia as exam is limited.  Abdomen obese, but not obvious direct or indirect hernia on exam Neurological:     Mental Status: He is alert.       UC Treatments / Results  Labs (all labs ordered are listed, but only abnormal results are displayed) Labs Reviewed  URINALYSIS, COMPLETE (UACMP) WITH MICROSCOPIC - Abnormal; Notable for the following components:      Result Value   Color, Urine AMBER (*)    APPearance CLOUDY (*)    Hgb urine dipstick MODERATE (*)    Ketones, ur TRACE (*)    Protein, ur 100 (*)    Leukocytes,Ua LARGE (*)    Bacteria, UA MANY (*)    All other components within normal limits  CHLAMYDIA/NGC RT PCR ([facility] ONLY)  URINE CULTURE    EKG   Radiology No results found.  Procedures Procedures (including critical care time)  Medications Ordered in UC Medications  ciprofloxacin (CIPRO) tablet 500 mg (has no administration in time range)  doxycycline (VIBRA-TABS) tablet 100 mg (has no administration in time range)    Initial Impression / Assessment and Plan / UC Course  I have reviewed the triage vital signs and the nursing notes.  Pertinent labs & imaging results that were available during my care of the patient were reviewed by me and considered in my medical decision making (see chart for details).      Final Clinical Impressions(s) / UC Diagnoses   Final diagnoses:  Scrotum swelling  Testicular pain, right  Acute cystitis with hematuria     Discharge Instructions     Please get ultrasound today.  Take medications as prescribed.  As your test results become available, someone will contact you for updated management    ED Prescriptions    None     PDMP not reviewed this encounter.   Delton See, MD 07/12/20 [phone]

## 2020-07-12 NOTE — Discharge Instructions (Addendum)
Please get ultrasound today.  Take medications as prescribed.  As your test results become available, someone will contact you for updated management

## 2020-07-13 ENCOUNTER — Other Ambulatory Visit (INDEPENDENT_AMBULATORY_CARE_PROVIDER_SITE_OTHER): Payer: Self-pay | Admitting: Family Medicine

## 2020-07-13 LAB — URINE CULTURE: Culture: <10000 — AB

## 2020-07-16 ENCOUNTER — Other Ambulatory Visit: Payer: Self-pay

## 2020-07-16 ENCOUNTER — Ambulatory Visit
Admission: EM | Admit: 2020-07-16 | Discharge: 2020-07-16 | Disposition: A | Discharge status: Home / Self Care | Payer: BC Managed Care – PPO | Attending: Emergency Medicine | Admitting: Emergency Medicine

## 2020-07-16 ENCOUNTER — Telehealth (HOSPITAL_COMMUNITY): Payer: Self-pay | Admitting: Emergency Medicine

## 2020-07-16 MED ORDER — AZITHROMYCIN 500 MG PO TABS
2000.0000 mg | ORAL_TABLET | Freq: Every day | ORAL | Status: DC
  Administered 2020-07-16: 2000 mg via ORAL

## 2020-07-16 NOTE — ED Triage Notes (Addendum)
Patient states that he is here for an injection for Gonorrhea. States that he was positive and the nurse told him to come by. Patient however is allergic to Penicillin. After speaking to Berneta Sages, NP he states that he would like to switch him to 2000mg  of azithromycin for one dose. Patient was given medication while in clinic.

## 2020-07-16 NOTE — Telephone Encounter (Signed)
Patient is positive for Gonorrhea.  Patient will need to return for IM Rocephin Contacted patient by phone. Verified identity using two identifiers. Provided positive result. Reviewed safe sex practices, notifying partners, and refraining from sexual activities for 7 days from time of treatment. Patient verified understanding, all questions answered. Will return to clinic today or tomorrow

## 2020-07-19 ENCOUNTER — Telehealth (HOSPITAL_COMMUNITY): Payer: Self-pay | Admitting: Emergency Medicine

## 2020-07-19 NOTE — Telephone Encounter (Signed)
This RN noted patient was treated with 2G Azithromycin for Gonorrhea due to Penicillin allergy.  Reviewed with Dr. Leonides Grills, who states patient will need to return for Gentamicin 240mg  IM.  [place] location does not keep Gentamicin on site.  Patient updated and encouraged to follow up with [facility] location in [place], [place].  Patient states he will come to our [place] location, and will come this weekend.  All questions answered.

## 2020-07-20 ENCOUNTER — Other Ambulatory Visit: Payer: Self-pay

## 2020-07-20 ENCOUNTER — Ambulatory Visit (HOSPITAL_COMMUNITY)
Admission: EM | Admit: 2020-07-20 | Discharge: 2020-07-20 | Disposition: A | Discharge status: Home / Self Care | Payer: BC Managed Care – PPO | Attending: Family Medicine | Admitting: Family Medicine

## 2020-07-20 DIAGNOSIS — A546 Gonococcal infection of anus and rectum: Secondary | ICD-10-CM | POA: Diagnosis not present

## 2020-07-20 MED ORDER — GENTAMICIN SULFATE 40 MG/ML IJ SOLN
240.0000 mg | Freq: Once | INTRAMUSCULAR | Status: AC
  Administered 2020-07-20: 240 mg via INTRAMUSCULAR

## 2020-07-20 MED ORDER — GENTAMICIN SULFATE 40 MG/ML IJ SOLN
INTRAMUSCULAR | Status: AC
  Filled 2020-07-20: qty 6

## 2020-07-20 NOTE — ED Triage Notes (Signed)
Pt presents for Gonorrhea treatment. Pt will be treat it with Gentamicin as he is allergic to Penicillin.   Pt was seen at [facility] but they do not have Gentamicin on site.

## 2021-12-14 ENCOUNTER — Other Ambulatory Visit: Payer: Self-pay

## 2021-12-14 ENCOUNTER — Ambulatory Visit
Admission: EM | Admit: 2021-12-14 | Discharge: 2021-12-14 | Disposition: A | Discharge status: Home / Self Care | Payer: Commercial Managed Care - PPO | Attending: Emergency Medicine | Admitting: Emergency Medicine

## 2021-12-14 ENCOUNTER — Encounter: Payer: Self-pay | Admitting: Emergency Medicine

## 2021-12-14 DIAGNOSIS — R509 Fever, unspecified: Secondary | ICD-10-CM | POA: Insufficient documentation

## 2021-12-14 DIAGNOSIS — U071 COVID-19: Secondary | ICD-10-CM | POA: Diagnosis present

## 2021-12-14 LAB — RESP PANEL BY RT-PCR (FLU A&B, COVID) ARPGX2
Influenza A by PCR: NEGATIVE
Influenza B by PCR: NEGATIVE
SARS Coronavirus 2 by RT PCR: POSITIVE — AB

## 2021-12-14 MED ORDER — NIRMATRELVIR/RITONAVIR (PAXLOVID)TABLET
3.0000 | ORAL_TABLET | Freq: Two times a day (BID) | ORAL | 0 refills | Status: AC
Start: 2021-12-14 — End: 2021-12-19

## 2021-12-14 NOTE — Discharge Instructions (Signed)
You were seen for fever and body aches and are being treated for COVID-19.  ? ?-Take Paxlovid as prescribed. ?-Continue symptomatic care with Tylenol and ibuprofen as needed.  Increase hydration. ?-Follow-up if your symptoms worsen while on this medication. ? ?Take care, ?Dr. Sharlet Salina, NP-c ? ?

## 2021-12-14 NOTE — ED Triage Notes (Signed)
Patient c/o cough, congestion, runny nose, and fever that started on Friday.  Patient reports taking Tylenol this morning.   ?

## 2021-12-14 NOTE — ED Provider Notes (Signed)
?[facility] - [place] ? ? ?Name: Mitchell Mcgee ?DOB: March 31, 1971 ?MRN: [id number] ?CSN: [id number] ?PCP: System, Provider Not In ? ?Arrival date and time:  ?12/14/21 1207 ? ?Chief Complaint:  ?Cough, Nasal Congestion, and Fever ? ? ?NOTE: Prior to seeing the patient today, I have reviewed the triage nursing documentation and vital signs. Clinical staff has updated patient's PMH/PSHx, current medication list, and drug allergies/intolerances to ensure comprehensive history available to assist in medical decision making.  ? ?History:   ?HPI: Mitchell Mcgee is a 51 y.o. male who presents today with complaints of cough, nasal congestion, fever, body aches and sneezing.  Patient states the symptoms started approximately 48 hours ago.  He has tried over-the-counter Alka-Seltzer and Coricidin to help with his symptoms but they are not helping.  He states he has felt feverish but he did not check his temperature at home. ?Patient is fully vaccinated against COVID-19.  Pertinent medical history includes hypertension, former smoker and obesity. ? ? ?Past Medical History:  ?Diagnosis Date  ? Hypertension   ? ? ?Past Surgical History:  ?Procedure Laterality Date  ? KNEE SURGERY Left   ? SHOULDER SURGERY Right   ? TONSILLECTOMY    ? ? ?Family History  ?Problem Relation Age of Onset  ? Hypertension Mother   ? Diabetes Mother   ? Cancer Mother   ? Hypertension Father   ? Cancer Father   ? ? ?Social History  ? ?Tobacco Use  ? Smoking status: Former  ?  Packs/day: 0.50  ?  Types: Cigarettes  ?  Quit date: 09/2017  ?  Years since quitting: 4.2  ? Smokeless tobacco: Never  ?Vaping Use  ? Vaping Use: Never used  ?Substance Use Topics  ? Alcohol use: No  ? Drug use: No  ? ? ?Patient Active Problem List  ? Diagnosis Date Noted  ? Fever, unspecified 12/14/2021  ? ? ?Home Medications:   ? ?Current Meds  ?Medication Sig  ? chlorthalidone (HYGROTON) 25 MG tablet Take 25 mg by mouth daily.  ? Multiple Vitamin (MULTIVITAMIN) capsule  Take 1 capsule by mouth daily.  ? nirmatrelvir/ritonavir EUA (PAXLOVID) 20 x 150 MG & 10 x 100MG  TABS Take 3 tablets by mouth 2 (two) times daily for 5 days. Patient GFR is 65. Take nirmatrelvir (150 mg) two tablets twice daily for 5 days and ritonavir (100 mg) one tablet twice daily for 5 days.  ? omeprazole (PRILOSEC) 20 MG capsule Take by mouth.  ? ? ?Allergies:   ?Penicillins ? ?Review of Systems (ROS): ?Review of Systems  ?Constitutional:  Positive for appetite change, chills and fatigue. Negative for activity change and diaphoresis.  ?HENT:  Positive for congestion, postnasal drip, sneezing and sore throat. Negative for sinus pain.   ?Respiratory:  Positive for cough and shortness of breath. Negative for wheezing.   ?Cardiovascular:  Negative for chest pain.  ?Gastrointestinal:  Negative for diarrhea, nausea and vomiting.  ?Musculoskeletal:  Positive for myalgias.  ?Neurological:  Positive for headaches. Negative for weakness.  ?All other systems reviewed and are negative.  ? ?Vital Signs: ?Today's Vitals  ? 12/14/21 1215 12/14/21 1220 12/14/21 1334  ?BP:  (!) 152/95   ?Pulse:  77   ?Resp:  15   ?Temp:  98.1 ?F (36.7 ?C)   ?TempSrc:  Oral   ?SpO2:  96%   ?Weight: 285 lb 0.9 oz (129.3 kg)    ?Height: 6' (1.829 m)    ?PainSc: 5   5   ? ? ?  Physical Exam: ?Physical Exam ? ? ?Urgent Care Treatments / Results:  ? ?LABS: ?PLEASE NOTE: all labs that were ordered this encounter are listed, however only abnormal results are displayed. ?Labs Reviewed  ?RESP PANEL BY RT-PCR (FLU A&B, COVID) ARPGX2 - Abnormal; Notable for the following components:  ?    Result Value  ? SARS Coronavirus 2 by RT PCR POSITIVE (*)   ? All other components within normal limits  ? ? ?EKG: ?-None ? ?RADIOLOGY: ?No results found. ? ?PROCEDURES: ?Procedures ? ?MEDICATIONS RECEIVED THIS VISIT: ?Medications - No data to display ? ?PERTINENT CLINICAL COURSE NOTES/UPDATES: ?  ?Initial Impression / Assessment and Plan / Urgent Care Course:  ?Pertinent  labs & imaging results that were available during my care of the patient were personally reviewed by me and considered in my medical decision making (see lab/imaging section of note for values and interpretations). ? ?Mitchell Mcgee is a 51 y.o. male who presents to [facility] today with complaints of viral URI symptoms, diagnosed with COVID-19, and treated as such with the medications below. NP and patient reviewed discharge instructions below during visit.  ? ?Patient is well appearing overall in clinic today. He does not appear to be in any acute distress. Presenting symptoms (see HPI) and exam as documented above.  ? ?I have reviewed the follow up and strict return precautions for any new or worsening symptoms. Patient is aware of symptoms that would be deemed urgent/emergent, and would thus require further evaluation either here or in the emergency department. At the time of discharge, he verbalized understanding and consent with the discharge plan as it was reviewed with him. All questions were fielded by provider and/or clinic staff prior to patient discharge.   ? ?Final Clinical Impressions / Urgent Care Diagnoses:  ? ?Final diagnoses:  ?Fever, unspecified  ?COVID-19  ? ? ?New Prescriptions:  ?[place] Controlled Substance Registry consulted? Not Applicable ? ?Meds ordered this encounter  ?Medications  ? nirmatrelvir/ritonavir EUA (PAXLOVID) 20 x 150 MG & 10 x 100MG  TABS  ?  Sig: Take 3 tablets by mouth 2 (two) times daily for 5 days. Patient GFR is 65. Take nirmatrelvir (150 mg) two tablets twice daily for 5 days and ritonavir (100 mg) one tablet twice daily for 5 days.  ?  Dispense:  30 tablet  ?  Refill:  0  ? ? ? ? ?Discharge Instructions   ? ?  ?You were seen for fever and body aches and are being treated for COVID-19.  ? ?-Take Paxlovid as prescribed. ?-Continue symptomatic care with Tylenol and ibuprofen as needed.  Increase hydration. ?-Follow-up if your symptoms worsen while on this  medication. ? ?Take care, ?Dr. , NP-c ? ? ? ? ?Recommended Follow up Care:  ?Patient encouraged to follow up with the following provider within the specified time frame, or sooner as dictated by the severity of his symptoms. As always, he was instructed that for any urgent/emergent care needs, he should seek care either here or in the emergency department for more immediate evaluation. ? ? ?Sharlet Salina, DNP, NP-c ?  ?Bailey Mech, NP ?12/14/21 1501 ? ?

## 2022-09-26 IMAGING — US US SCROTUM W/ DOPPLER COMPLETE
1 series · 14 of 25 positions shown · non-contrast
Comparison: None.

CLINICAL DATA: Acute right testicular pain and swelling.

EXAM:
SCROTAL ULTRASOUND
DOPPLER ULTRASOUND OF THE TESTICLES
TECHNIQUE: Complete ultrasound examination of the testicles, epididymis, and
other scrotal structures was performed. Color and spectral Doppler
ultrasound were also utilized to evaluate blood flow to the
testicles.

[Series 1: us scrotum w/ doppler complete · 0.07mm/px · 69 acquisitions, 14 frames shown]
[im 1/69]
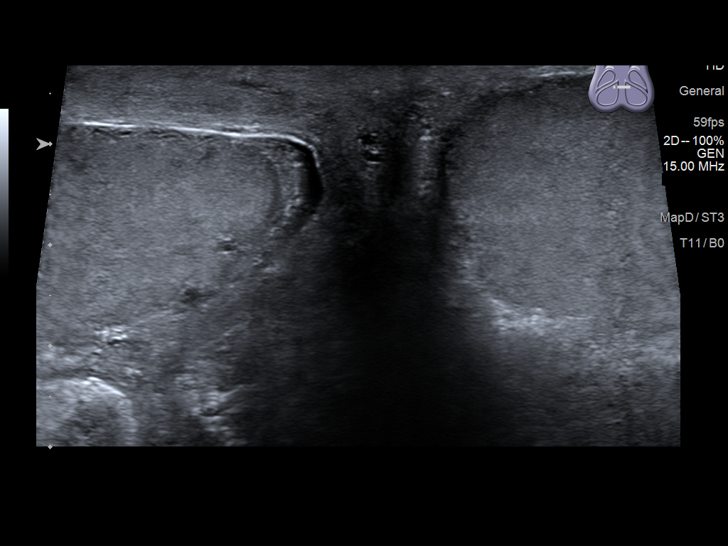
[im 6/69]
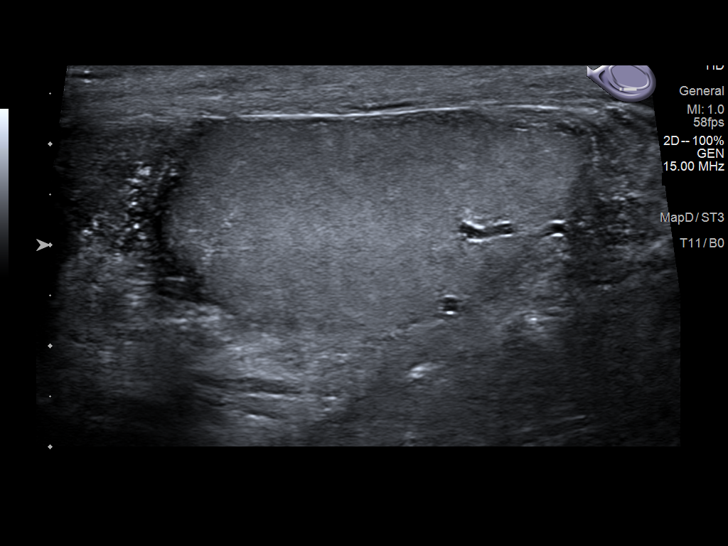
[im 12/69]
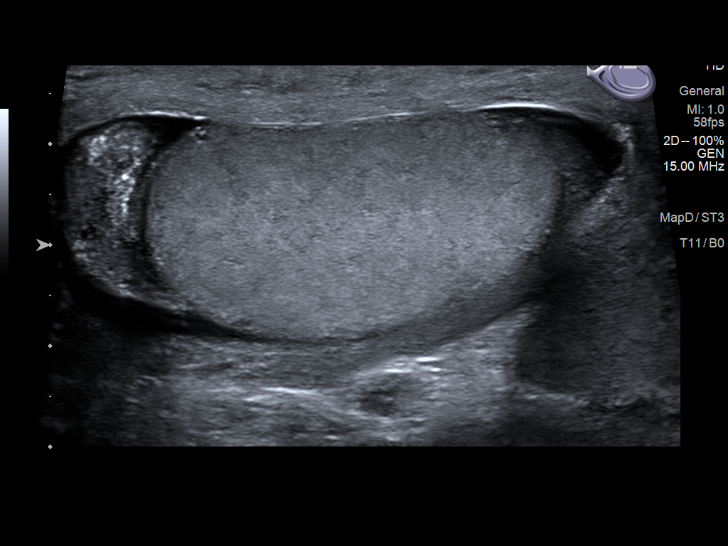
[im 18/69]
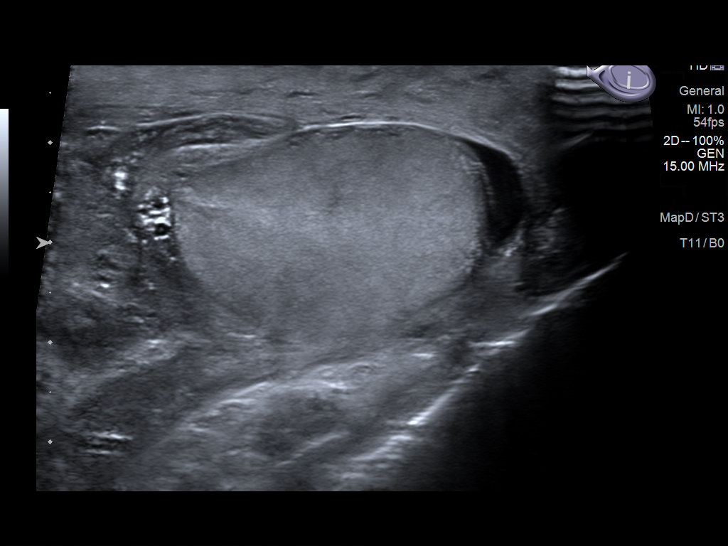
[im 23/69]
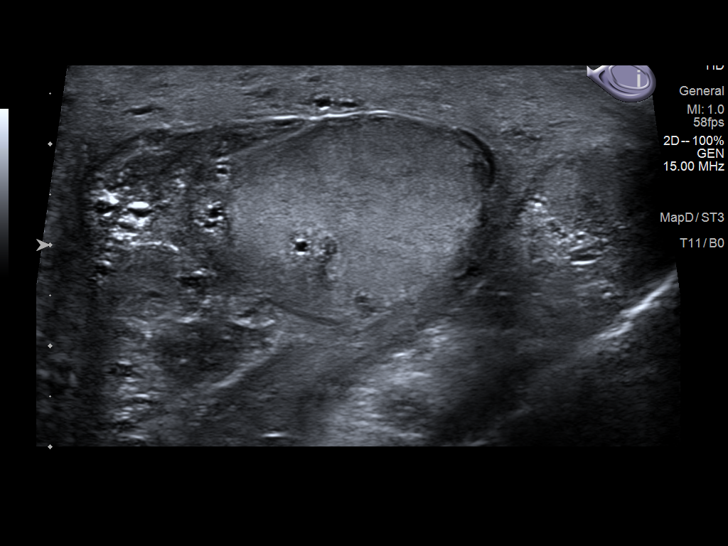
[im 26/69]
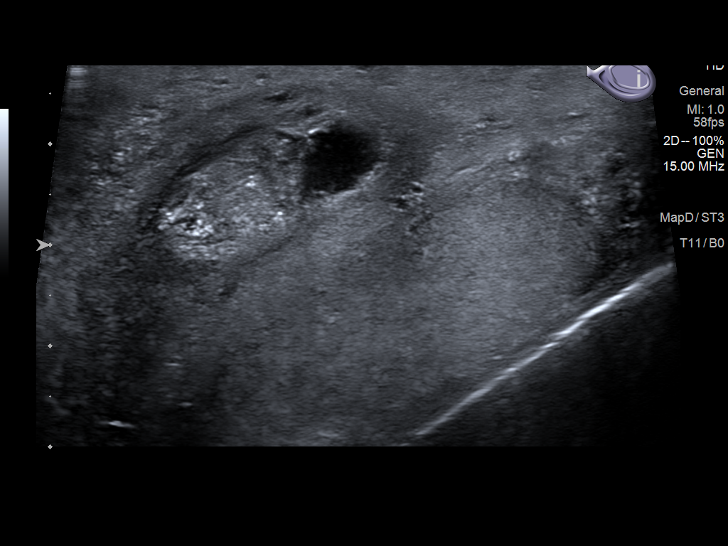
[im 32/69]
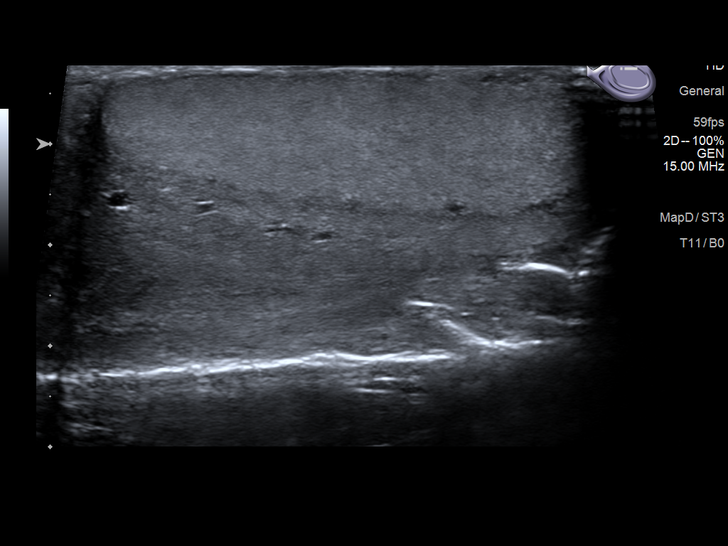
[im 37/69]
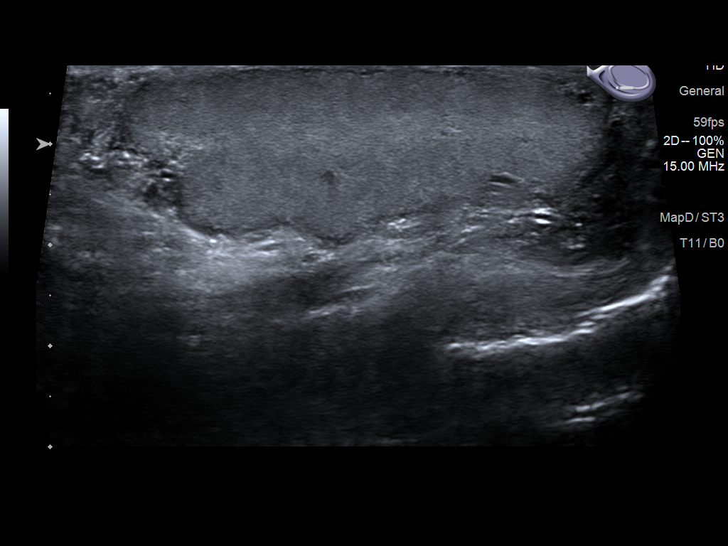
[im 43/69]
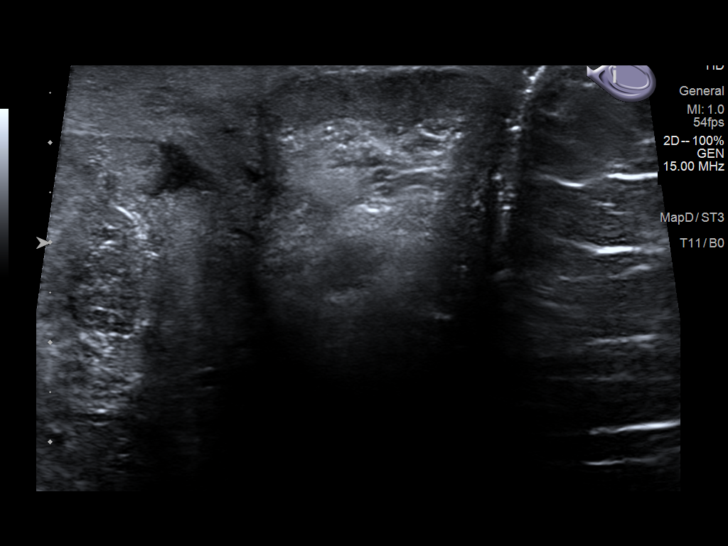
[im 46/69]
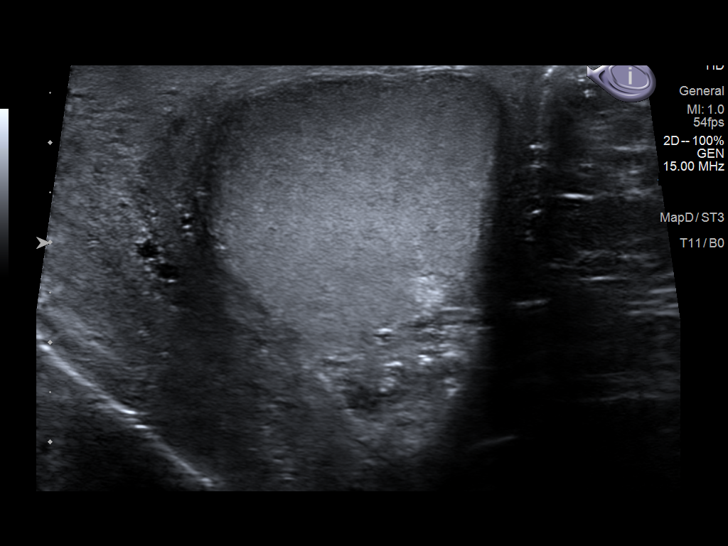
[im 52/69]
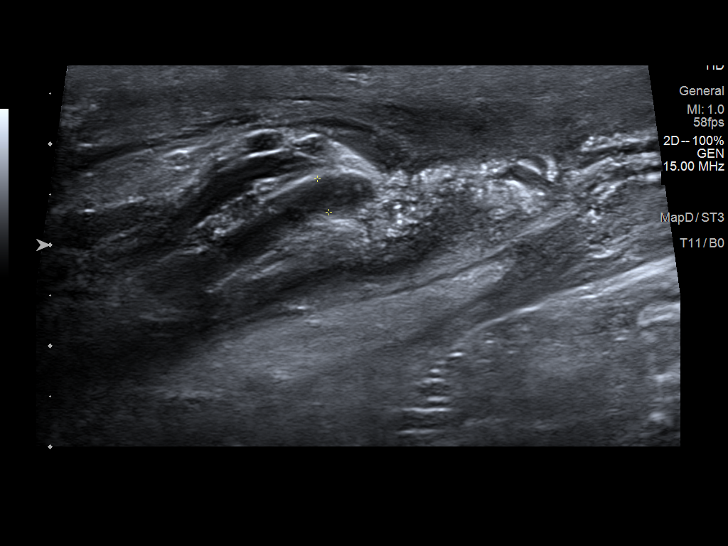
[im 57/69]
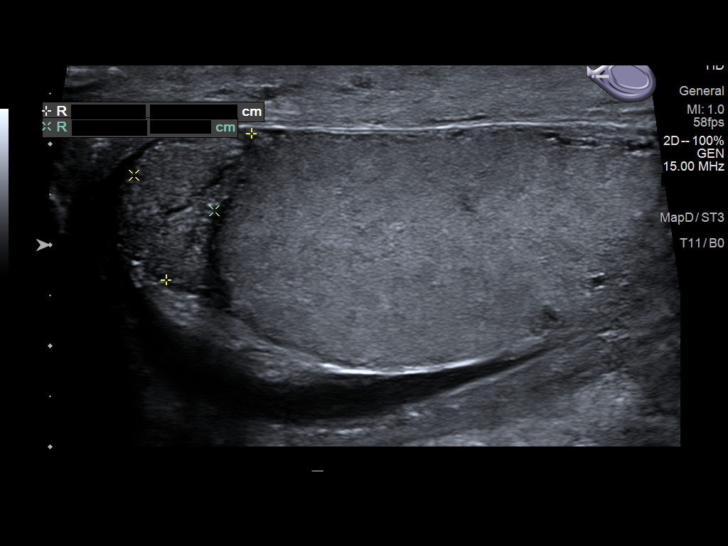
[im 63/69]
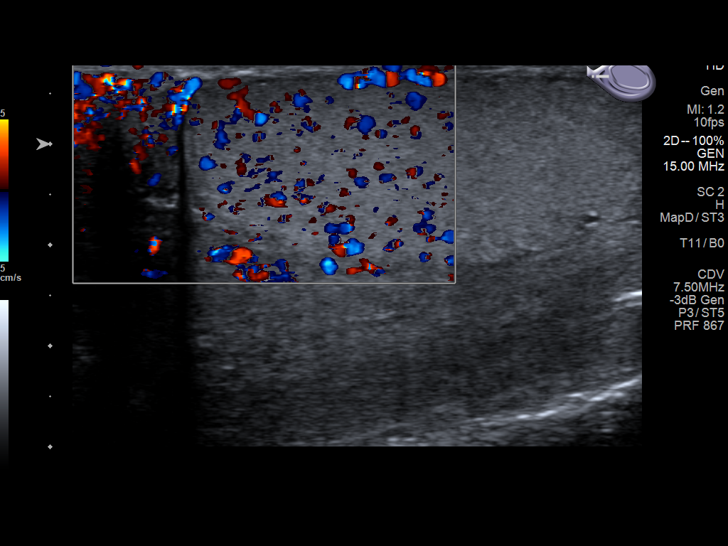
[im 69/69]
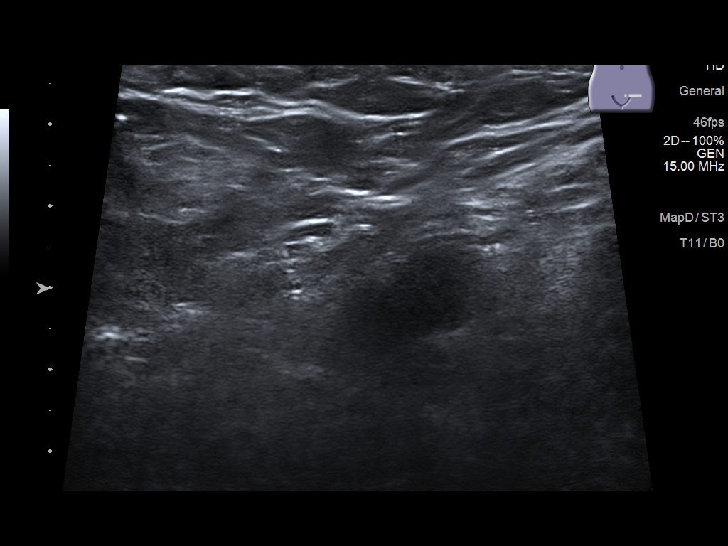

[14 of 25 positions shown; findings below may reference images not displayed]

FINDINGS: Right testicle

Measurements: 4.4 x 3.0 x 2.4 cm. No mass or microlithiasis
visualized.

Left testicle

Measurements: 5.1 x 2.7 x 2.0 cm. No mass or microlithiasis
visualized.

Right epididymis:  Normal in size and appearance.

Left epididymis:  Normal in size and appearance.

Hydrocele:  Small right hydrocele is noted

Varicocele:  Mild bilateral varicoceles are noted.

Pulsed Doppler interrogation of both testes demonstrates normal low
resistance arterial and venous waveforms bilaterally.
IMPRESSION: No evidence of testicular mass or torsion. Small right hydrocele.
Mild bilateral varicoceles.
